# Patient Record
Sex: Female | Born: 1955 | Race: White | Hispanic: No | Marital: Married | Smoking: Former smoker
Health system: Southern US, Community
[De-identification: ages and names within clinical notes are randomized; demographics above are authoritative.]

## PROBLEM LIST (undated history)

## (undated) DIAGNOSIS — C801 Malignant (primary) neoplasm, unspecified: Secondary | ICD-10-CM

## (undated) DIAGNOSIS — M797 Fibromyalgia: Secondary | ICD-10-CM

## (undated) DIAGNOSIS — C439 Malignant melanoma of skin, unspecified: Secondary | ICD-10-CM

## (undated) DIAGNOSIS — C50919 Malignant neoplasm of unspecified site of unspecified female breast: Secondary | ICD-10-CM

## (undated) HISTORY — DX: Malignant melanoma of skin, unspecified: C43.9

## (undated) HISTORY — DX: Fibromyalgia: M79.7

## (undated) HISTORY — DX: Malignant neoplasm of unspecified site of unspecified female breast: C50.919

## (undated) HISTORY — PX: TONSILLECTOMY: SUR1361

## (undated) HISTORY — PX: ABDOMINAL HYSTERECTOMY: SHX81

## (undated) HISTORY — PX: OTHER SURGICAL HISTORY: SHX169

---

## 2003-05-02 HISTORY — PX: MASTECTOMY: SHX3

## 2005-07-04 ENCOUNTER — Ambulatory Visit: Admission: RE | Admit: 2005-07-04 | Discharge: 2005-09-12 | Payer: Self-pay | Admitting: *Deleted

## 2005-07-14 ENCOUNTER — Ambulatory Visit (HOSPITAL_COMMUNITY): Admission: RE | Admit: 2005-07-14 | Discharge: 2005-07-14 | Payer: Self-pay | Admitting: Hematology and Oncology

## 2010-10-27 ENCOUNTER — Other Ambulatory Visit (HOSPITAL_COMMUNITY): Payer: Self-pay | Admitting: Internal Medicine

## 2010-10-27 DIAGNOSIS — C50919 Malignant neoplasm of unspecified site of unspecified female breast: Secondary | ICD-10-CM

## 2010-11-21 ENCOUNTER — Encounter (HOSPITAL_COMMUNITY)
Admission: RE | Admit: 2010-11-21 | Discharge: 2010-11-21 | Disposition: A | Discharge status: Home / Self Care | Payer: PRIVATE HEALTH INSURANCE | Source: Ambulatory Visit | Attending: Internal Medicine | Admitting: Internal Medicine

## 2010-11-21 ENCOUNTER — Other Ambulatory Visit (HOSPITAL_COMMUNITY): Payer: Self-pay | Admitting: Hematology and Oncology

## 2010-11-21 ENCOUNTER — Encounter (HOSPITAL_COMMUNITY): Payer: Self-pay

## 2010-11-21 DIAGNOSIS — C50919 Malignant neoplasm of unspecified site of unspecified female breast: Secondary | ICD-10-CM | POA: Insufficient documentation

## 2010-11-21 HISTORY — DX: Malignant (primary) neoplasm, unspecified: C80.1

## 2010-11-21 LAB — GLUCOSE, CAPILLARY: Glucose-Capillary: 98 mg/dL (ref 70–99)

## 2010-11-21 MED ORDER — FLUDEOXYGLUCOSE F - 18 (FDG) INJECTION
16.7000 | Freq: Once | INTRAVENOUS | Status: AC | PRN
  Administered 2010-11-21: 16.7 via INTRAVENOUS

## 2011-10-30 ENCOUNTER — Encounter: Payer: PRIVATE HEALTH INSURANCE | Admitting: Hematology and Oncology

## 2011-10-30 DIAGNOSIS — C50919 Malignant neoplasm of unspecified site of unspecified female breast: Secondary | ICD-10-CM

## 2019-03-26 HISTORY — PX: COLONOSCOPY: SHX174

## 2019-06-30 NOTE — Progress Notes (Signed)
NEUROLOGY CONSULTATION NOTE  ELIDIA ROSEN MRN: [id number] DOB: 03/28/1956  Referring provider: Jerene Bears, MD Primary care provider: Jerene Bears, MD  Reason for consult:  Bell's palsy  HISTORY OF PRESENT ILLNESS: Kristen Solis is a 64 year old right-handed female who presents for Bell's palsy.  History supplemented by referring provider note.  She started having dizziness around Thanksgiving.  She stood up and started experiencing spinning.  It would be triggered by movement lasting a couple of minutes but would still feel lightheaded in between attacks.  This went on for about an hour .  She had a CT head.  Results not available but she was told that she had an old stroke but likely an incidental finding.  Around the same time, she started drooling from both sides of her mouth.  She was told by her rheumatologist that she may have had Bell's palsy.  She denies noticing unilateral or bilateral facial weakness, facial numbness, hyperacusis or difficulty closing an eye.  She reports blurred vision for which she wears glasses but believes that the vision is still poor with glasses if she is reading for prolonged periods.  At the time, she also noted dark discoloration right sided of neck with associated pain It was painful to turn her neck.  She had pain across upper back and back of head and soreness head. No radicular pain down the arm.  She has fibromyalgia. Symptoms overall improved but now thinks that the drooling has more recently increased, worse on left.  Gradual.   PAST MEDICAL HISTORY: Past Medical History:  Diagnosis Date  . Cancer [facility])     PAST SURGICAL HISTORY: Past Surgical History:  Procedure Laterality Date  . ABDOMINAL HYSTERECTOMY    . ingrown toenails    . masectomy    . right thumb surgery    . TONSILLECTOMY      MEDICATIONS: Outpatient Encounter Medications as of 07/01/2019  Medication Sig  . acetaminophen (TYLENOL) 500 MG tablet Take by mouth.  . baclofen  (LIORESAL) 10 MG tablet Take by mouth.  . Cetirizine HCl 10 MG CAPS Take by mouth.  . furosemide (LASIX) 40 MG tablet Take by mouth.  . naproxen (NAPROSYN) 375 MG tablet Take by mouth.   No facility-administered encounter medications on file as of 07/01/2019.    ALLERGIES: Allergies  Allergen Reactions  . Codeine   . Sulfa Drugs Cross Reactors     FAMILY HISTORY: History reviewed. No pertinent family history.  SOCIAL HISTORY: Social History   Socioeconomic History  . Marital status: Married    Spouse name: Not on file  . Number of children: Not on file  . Years of education: Not on file  . Highest education level: Not on file  Occupational History  . Not on file  Tobacco Use  . Smoking status: Not on file  Substance and Sexual Activity  . Alcohol use: Not on file  . Drug use: Not on file  . Sexual activity: Not on file  Other Topics Concern  . Not on file  Social History Narrative  . Not on file   Social Determinants of Health   Financial Resource Strain:   . Difficulty of Paying Living Expenses: Not on file  Food Insecurity:   . Worried About Charity fundraiser in the Last Year: Not on file  . Ran Out of Food in the Last Year: Not on file  Transportation Needs:   . Lack of Transportation (Medical): Not  on file  . Lack of Transportation (Non-Medical): Not on file  Physical Activity:   . Days of Exercise per Week: Not on file  . Minutes of Exercise per Session: Not on file  Stress:   . Feeling of Stress : Not on file  Social Connections:   . Frequency of Communication with Friends and Family: Not on file  . Frequency of Social Gatherings with Friends and Family: Not on file  . Attends Religious Services: Not on file  . Active Member of Clubs or Organizations: Not on file  . Attends Archivist Meetings: Not on file  . Marital Status: Not on file  Intimate Partner Violence:   . Fear of Current or Ex-Partner: Not on file  . Emotionally Abused: Not  on file  . Physically Abused: Not on file  . Sexually Abused: Not on file    PHYSICAL EXAM: Blood pressure (!) 143/88, pulse 74, resp. rate 18, height 5\' 4"  (1.626 m), weight 153 lb (69.4 kg), SpO2 97 %. General: No acute distress.  Patient appears well-groomed.  Head:  Normocephalic/atraumatic Eyes:  fundi examined but not visualized Neck: supple, no paraspinal tenderness, full range of motion Back: No paraspinal tenderness Heart: regular rate and rhythm Lungs: Clear to auscultation bilaterally. Vascular: No carotid bruits. Neurological Exam: Mental status: alert and oriented to person, place, and time, recent and remote memory intact, fund of knowledge intact, attention and concentration intact, speech fluent and not dysarthric, language intact. Cranial nerves: CN I: not tested CN II: pupils equal, round and reactive to light, visual fields intact CN III, IV, VI:  full range of motion, no nystagmus, no ptosis CN V: facial sensation intact CN VII: upper and lower face symmetric CN VIII: hearing intact CN IX, X: gag intact, uvula midline CN XI: sternocleidomastoid and trapezius muscles intact CN XII: tongue midline Bulk & Tone: normal, no fasciculations. Motor:  5/5 throughout  Sensation:  Pinprick and vibration sensation intact.   Deep Tendon Reflexes:  2+ throughout, toes downgoing.   Finger to nose testing:  Without dysmetria.   Heel to shin:  Without dysmetria.   Gait:  Normal station and stride.  Able to turn and tandem walk. Romberg negative  IMPRESSION: 1.  Episode of vertigo 2.  Drooling.    Unclear etiologies.  The episode of vertigo may have been peripheral vertigo.  I am uncertain about the drooling.  She does not describe symptoms associated Bell's palsy or other convincing lateralizing symptoms.  Neurologic exam does not reveal any obvious lateralizing signs.  PLAN: 1.  I think MRI of brain with and without contrast is reasonable. 2.  Further recommendations  pending results.  Thank you for allowing me to take part in the care of this patient.  Metta Clines, DO  CC:  Jerene Bears, MD

## 2019-07-01 ENCOUNTER — Other Ambulatory Visit: Payer: Self-pay

## 2019-07-01 ENCOUNTER — Encounter: Payer: Self-pay | Admitting: Neurology

## 2019-07-01 ENCOUNTER — Ambulatory Visit (INDEPENDENT_AMBULATORY_CARE_PROVIDER_SITE_OTHER): Payer: Commercial Managed Care - PPO | Admitting: Neurology

## 2019-07-01 VITALS — BP 143/88 | HR 74 | Resp 18 | Ht 64.0 in | Wt 153.0 lb

## 2019-07-01 DIAGNOSIS — Z85820 Personal history of malignant melanoma of skin: Secondary | ICD-10-CM

## 2019-07-01 DIAGNOSIS — H538 Other visual disturbances: Secondary | ICD-10-CM

## 2019-07-01 DIAGNOSIS — R42 Dizziness and giddiness: Secondary | ICD-10-CM | POA: Diagnosis not present

## 2019-07-01 DIAGNOSIS — R29810 Facial weakness: Secondary | ICD-10-CM

## 2019-07-01 NOTE — Patient Instructions (Addendum)
What you are describing doesn't sound like Bell's palsy The dizziness in November may have been inner ear issue However, given your symptoms, we should definitely order MRI of brain with and without contrast Further recommendations pending results.    We have sent a referral to [facility] for your MRI and they will call you directly to schedule your appointment. They are located at [place]. If you need to contact them directly please call [phone].

## 2019-07-07 HISTORY — PX: ESOPHAGOGASTRODUODENOSCOPY: SHX1529

## 2019-08-04 ENCOUNTER — Ambulatory Visit
Admission: RE | Admit: 2019-08-04 | Discharge: 2019-08-04 | Disposition: A | Discharge status: Home / Self Care | Payer: Commercial Managed Care - PPO | Source: Ambulatory Visit | Attending: Neurology | Admitting: Neurology

## 2019-08-04 ENCOUNTER — Other Ambulatory Visit: Payer: Self-pay

## 2019-08-04 DIAGNOSIS — R42 Dizziness and giddiness: Secondary | ICD-10-CM

## 2019-08-04 DIAGNOSIS — Z85820 Personal history of malignant melanoma of skin: Secondary | ICD-10-CM

## 2019-08-04 DIAGNOSIS — R29810 Facial weakness: Secondary | ICD-10-CM

## 2019-08-04 DIAGNOSIS — H538 Other visual disturbances: Secondary | ICD-10-CM

## 2019-08-04 MED ORDER — GADOBENATE DIMEGLUMINE 529 MG/ML IV SOLN
13.0000 mL | Freq: Once | INTRAVENOUS | Status: AC | PRN
  Administered 2019-08-04: 10:00:00 13 mL via INTRAVENOUS

## 2020-07-01 ENCOUNTER — Encounter (INDEPENDENT_AMBULATORY_CARE_PROVIDER_SITE_OTHER): Payer: Self-pay | Admitting: *Deleted

## 2020-10-04 ENCOUNTER — Other Ambulatory Visit: Payer: Self-pay

## 2020-10-04 ENCOUNTER — Ambulatory Visit (INDEPENDENT_AMBULATORY_CARE_PROVIDER_SITE_OTHER): Payer: Commercial Managed Care - PPO | Admitting: Gastroenterology

## 2020-10-04 ENCOUNTER — Encounter (INDEPENDENT_AMBULATORY_CARE_PROVIDER_SITE_OTHER): Payer: Self-pay | Admitting: Gastroenterology

## 2020-10-04 DIAGNOSIS — R1110 Vomiting, unspecified: Secondary | ICD-10-CM

## 2020-10-04 MED ORDER — ESOMEPRAZOLE MAGNESIUM 40 MG PO CPDR: 40.0000 mg | DELAYED_RELEASE_CAPSULE | Freq: Every day | ORAL | 3 refills | Status: AC

## 2020-10-04 NOTE — Patient Instructions (Signed)
Will start Nexium 40 mg qday  Instruction provided in the use of antireflux medication - patient should take medication in the morning 30-45 minutes before eating breakfast. Discussed avoidance of eating within 2 hours of lying down to sleep and benefit of blocks to elevate head of bed. If symptoms recur, will plan for pH impedance testing OFF PPI

## 2020-10-04 NOTE — Progress Notes (Signed)
Kristen Solis, M.D. Gastroenterology & Hepatology [facility] [address] [place], [place] [address] Primary Care Physician: Glenda Chroman, MD [address] [place] [address]  Referring MD: PCP  Chief Complaint: Regurgitation  History of Present Illness: Kristen Solis is a 65 y.o. female with past medical history of melanoma s/p resection of thumb, skin cancer, breast cancer s/p R mastectomy with recurrence but now on remission, fibromyalgia, who presents for evaluation of regurgitation.  Patient reports that for the last year she has presented recurrent episodes of regurgitation of acid contents in her mouth. States the episodes are worse depending on the type of food that she eats, but it is happening almost on a daily basis even if she tries to avoid.  Reports. Denies any heartburn sensation.  Has noticed daily nausea for the last month but she has not presented any vomiting. Due to these symptoms she was given Prilosec 20 mg every day for a year (was taking it in the AM daily and usually did not have breakfast) but denied any improvement with the medication, so stopped taking it 2 months ago. The patient denies having any dysphagia, odynophagia, heartburn, fever, chills, hematochezia, melena, hematemesis, abdominal distention, abdominal pain,  jaundice, pruritus or weight loss.  She is concerned as she has felt more short of breath when she is sitting up. She has presented shortness of breath when laying flat so she has had to sleep with her head elevated. States she has been seen a pulmonologist but she has not been given a reason of why she is SOB. She states she feels SOB quite frequently.  States that she has also seen a cardiologist.  So far she does not know why she has these symptoms.  Last year she was having multiple episodes of vomiting and nausea but patient reports that her symptoms improved after she tried to eat more  vegetables and less greasy food. Now she is having a BM every day.  Last EGD:07/07/2019 -hiatal hernia, normal stomach and duodenum. Last Colonoscopy: 2021 - normal per patient, no report is available  FHx: neg for any gastrointestinal/liver disease, no malignancies Social: quit smoking 3 years ago, neg alcohol or illicit drug use Surgical: hysterectomy and tubal ligation  Past Medical History: Past Medical History:  Diagnosis Date  . Cancer [facility])     Past Surgical History: Past Surgical History:  Procedure Laterality Date  . ABDOMINAL HYSTERECTOMY    . ingrown toenails    . masectomy    . right thumb surgery    . TONSILLECTOMY      Family History:History reviewed. No pertinent family history.  Social History: Social History   Tobacco Use  Smoking Status Former Smoker  Smokeless Tobacco Never Used   Social History   Substance and Sexual Activity  Alcohol Use Never   Social History   Substance and Sexual Activity  Drug Use Never    Allergies: Allergies  Allergen Reactions  . Codeine   . Sulfa Drugs Cross Reactors     Medications: Current Outpatient Medications  Medication Sig Dispense Refill  . acetaminophen (TYLENOL) 500 MG tablet Take by mouth as needed.    . Cetirizine HCl 10 MG CAPS Take by mouth daily.    Marland Kitchen LYSINE PO Take by mouth in the morning and at bedtime.    Marland Kitchen spironolactone (ALDACTONE) 25 MG tablet Take 25 mg by mouth daily.     No current facility-administered medications for this  visit.    Review of Systems: GENERAL: negative for malaise, night sweats HEENT: No changes in hearing or vision, no nose bleeds or other nasal problems. NECK: Negative for lumps, goiter, pain and significant neck swelling RESPIRATORY: Negative for cough, wheezing CARDIOVASCULAR: Negative for chest pain, leg swelling, palpitations, orthopnea GI: SEE HPI MUSCULOSKELETAL: Negative for joint pain or swelling, back pain, and muscle pain. SKIN: Negative for lesions,  rash PSYCH: Negative for sleep disturbance, mood disorder and recent psychosocial stressors. HEMATOLOGY Negative for prolonged bleeding, bruising easily, and swollen nodes. ENDOCRINE: Negative for cold or heat intolerance, polyuria, polydipsia and goiter. NEURO: negative for tremor, gait imbalance, syncope and seizures. The remainder of the review of systems is noncontributory.   Physical Exam: BP (!) 143/80 (BP Location: Left Arm, Patient Position: Sitting, Cuff Size: Large)   Pulse 81   Temp 98.6 F (37 C) (Oral)   Ht 5\' 4"  (1.626 m)   Wt 159 lb (72.1 kg)   BMI 27.29 kg/m  GENERAL: The patient is AO x3, in no acute distress. HEENT: Head is normocephalic and atraumatic. EOMI are intact. Mouth is well hydrated and without lesions. NECK: Supple. No masses LUNGS: Clear to auscultation. No presence of rhonchi/wheezing/rales. Adequate chest expansion HEART: RRR, normal s1 and s2. ABDOMEN: Soft, nontender, no guarding, no peritoneal signs, and nondistended. BS +. No masses. EXTREMITIES: Without any cyanosis, clubbing, rash, lesions or edema. NEUROLOGIC: AOx3, no focal motor deficit. SKIN: no jaundice, no rashes   Imaging/Labs: as above  I personally reviewed and interpreted the available labs, imaging and endoscopic files.  Impression and Plan: Kristen Solis is a 65 y.o. female with past medical history of melanoma s/p resection of thumb, skin cancer, breast cancer s/p R mastectomy with recurrence but now on remission, fibromyalgia, who presents for evaluation of regurgitation.  The patient has presented recurrent symptoms of regurgitation without presence of red flag signs.  She did not improve with intake of PPI for a year, even though she took it compliantly.  I had a thorough discussion with the patient regarding the fact that it may be important to evaluate her symptoms further with a pH impedance testing for 24 hours as it is unusual for regurgitation due to GERD to worsen while  she was on a PPI.  This could be related to nonerosive reflux versus functional etiology.  However, the patient reported that she had "multiple issues ongoing at the moment" I would like to try other medication before proceeding with this test.  I will start her on Nexium 40 mg every day.  If her symptoms recur, we will need to proceed with further testing with of PPI.  Regarding her shortness of breath, her symptoms do not seem to be associated to her regurgitation episodes.  I advised the patient to follow-up again with her pulmonologist and cardiologist to evaluate this further.  She understood and agreed.  - Will start Nexium 40 mg qday - Instruction provided in the use of antireflux medication - patient should take medication in the morning 30-45 minutes before eating breakfast. Discussed avoidance of eating within 2 hours of lying down to sleep and benefit of blocks to elevate head of bed. - If symptoms recur, will plan for pH impedance testing OFF PPI - Follow up with pulmonologist and cardiologist  All questions were answered.      Kristen Peppers, MD Gastroenterology and Hepatology [facility]

## 2020-10-05 ENCOUNTER — Encounter (INDEPENDENT_AMBULATORY_CARE_PROVIDER_SITE_OTHER): Payer: Self-pay

## 2021-01-18 IMAGING — MR MR HEAD WO/W CM
11 series · 48 of 48 positions shown · IV contrast (13 ml Multihance)
Comparison: None.

CLINICAL DATA: Headaches. Vertigo. Facial weakness. Blurred vision.
Personal history of melanoma and breast cancer.

EXAM:
MRI HEAD WITHOUT AND WITH CONTRAST
TECHNIQUE: Multiplanar, multiecho pulse sequences of the brain and surrounding
structures were obtained without and with intravenous contrast.
CONTRAST:  13mL MULTIHANCE GADOBENATE DIMEGLUMINE 529 MG/ML IV SOLN

[Series 3: T1 · sagittal · 5.0mm · 0.45mm/px · 1 of 21 slices shown]
[im 1/21]
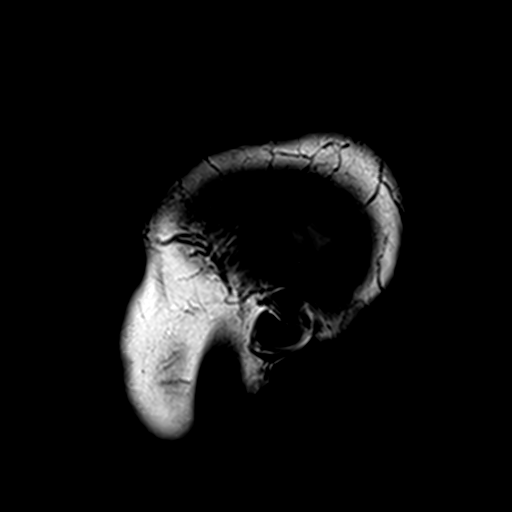

[Series 4: DWI · axial · 3.0mm · 1.80mm/px · z∈[-71,+76]mm · 8 of 100 slices shown (1 of 2)]
[im 1/100]
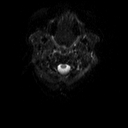
[im 15/100]
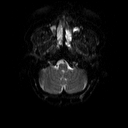
[im 29/100]
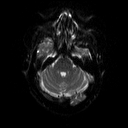
[im 43/100]
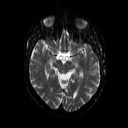
[im 57/100]
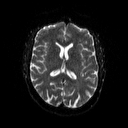
[im 71/100]
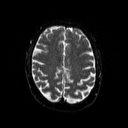
[im 85/100]
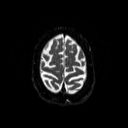
[im 100/100]
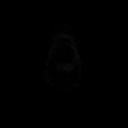

[Series 5: DWI · axial · 3.0mm · 1.80mm/px · z∈[-71,+76]mm · 4 of 50 slices shown (2 of 2)]
[im 1/50]
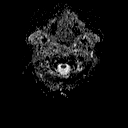
[im 17/50]
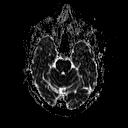
[im 33/50]
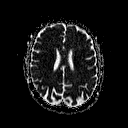
[im 50/50]
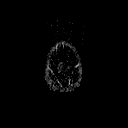

[Series 6: T2 · axial · 5.0mm · 0.51mm/px · z∈[-76,+79]mm · 2 of 24 slices shown (1 of 2)]
[im 1/24]
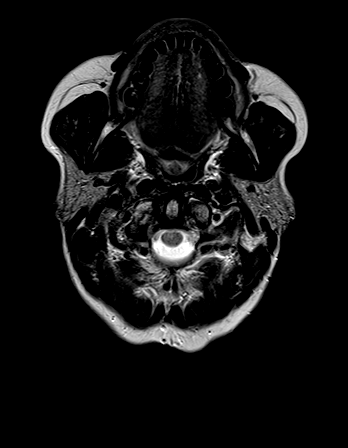
[im 24/24]
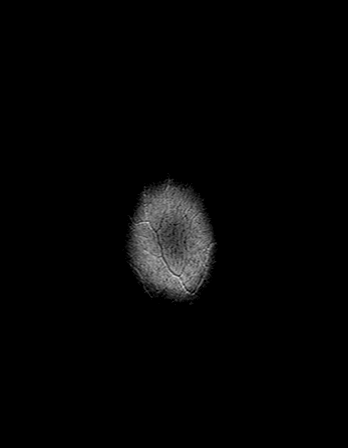

[Series 7: FLAIR · axial · 3.0mm · 0.45mm/px · z∈[-74,+79]mm · 2 of 30 slices shown]
[im 1/30]
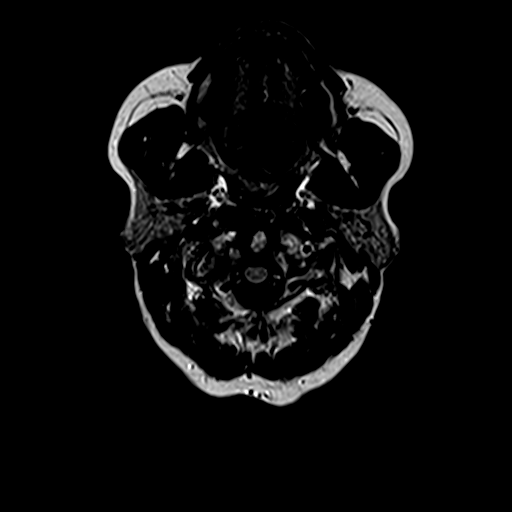
[im 30/30]
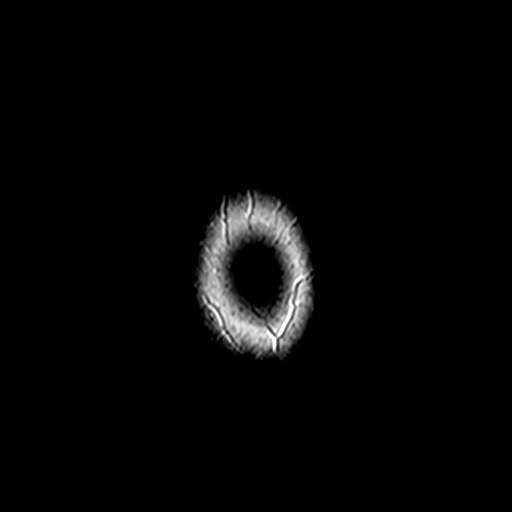

[Series 9: swi_images · axial · 4.0mm · 0.90mm/px · z∈[-68,+72]mm · 3 of 36 slices shown]
[im 1/36]
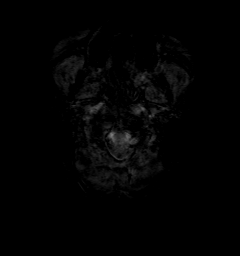
[im 18/36]
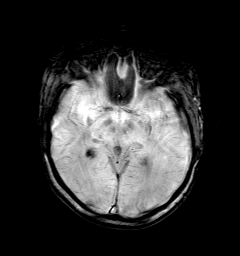
[im 36/36]
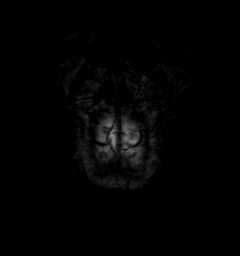

[Series 10: t1_mpr_tra · axial · 1.0mm · 0.75mm/px · z∈[-70,+73]mm · 11 of 144 slices shown (1 of 2)]
[im 1/144]
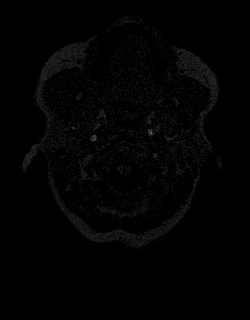
[im 15/144]
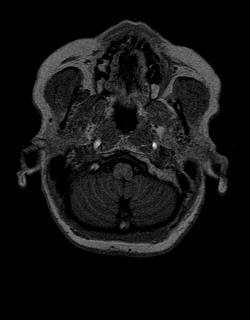
[im 29/144]
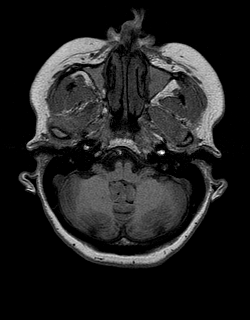
[im 43/144]
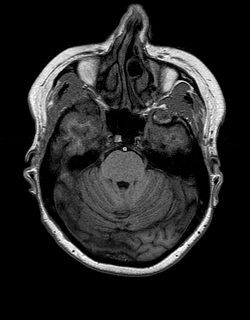
[im 58/144]
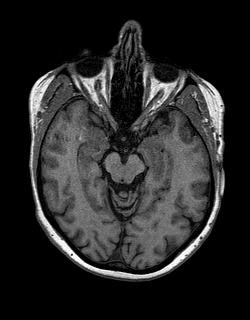
[im 72/144]
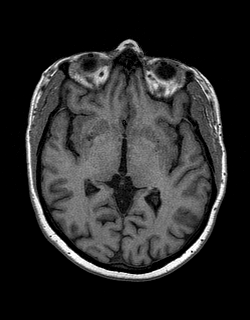
[im 86/144]
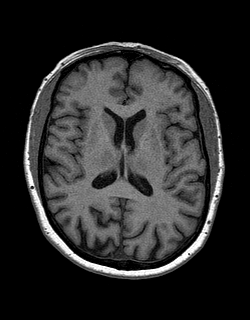
[im 101/144]
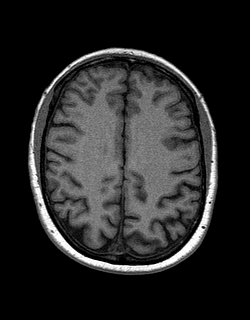
[im 115/144]
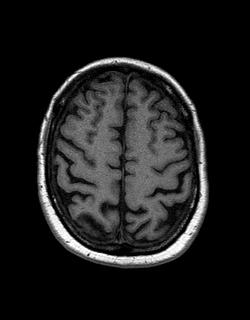
[im 129/144]
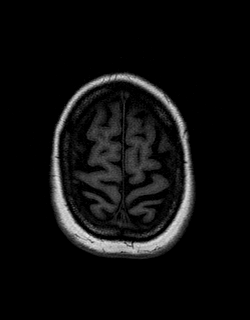
[im 144/144]
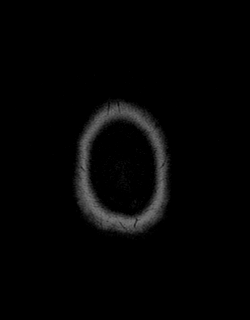

[Series 11: T2 · coronal · 5.0mm · 0.45mm/px · 2 of 25 slices shown (2 of 2)]
[im 1/25]
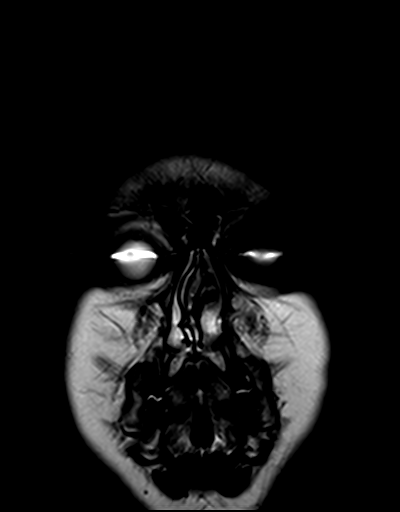
[im 25/25]
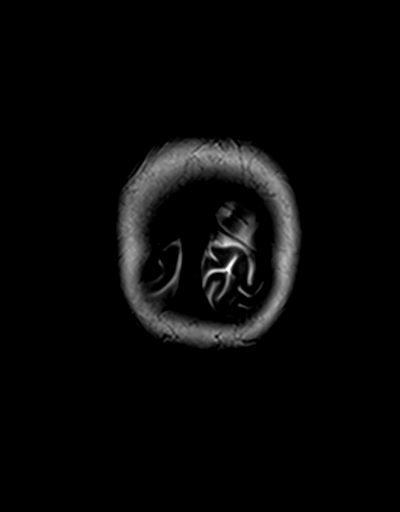

[Series 12: t1_mpr_tra · axial · 1.0mm · 0.75mm/px · z∈[-70,+73]mm · 11 of 144 slices shown (2 of 2)]
[im 1/144]
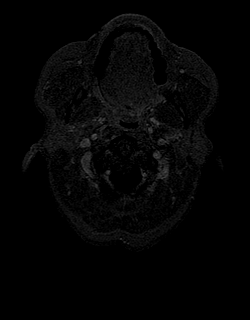
[im 15/144]
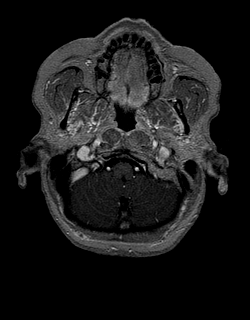
[im 29/144]
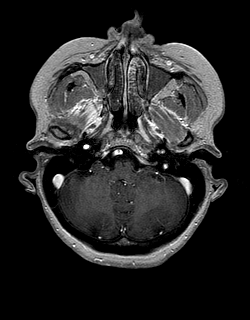
[im 43/144]
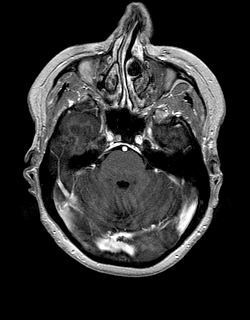
[im 58/144]
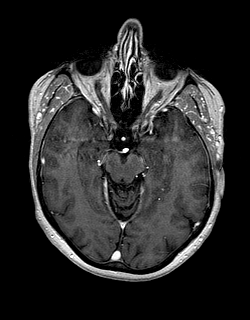
[im 72/144]
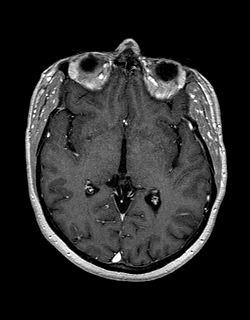
[im 86/144]
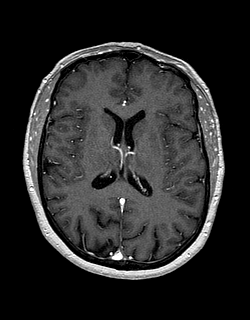
[im 101/144]
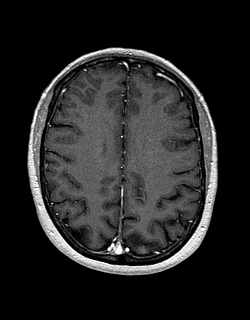
[im 115/144]
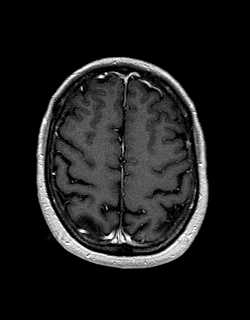
[im 129/144]
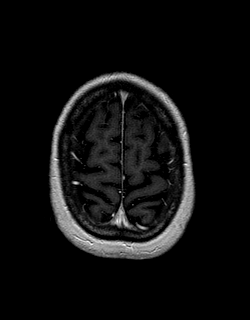
[im 144/144]
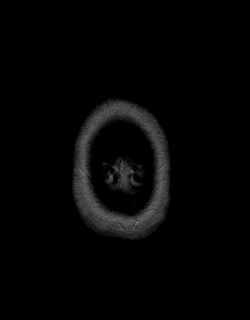

[Series 13: post cor · coronal · 5.0mm · 0.45mm/px · 2 of 25 slices shown]
[im 1/25]
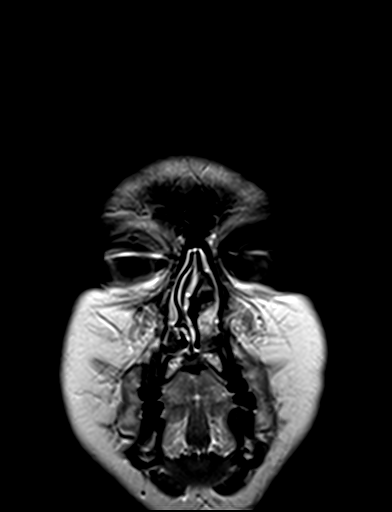
[im 25/25]
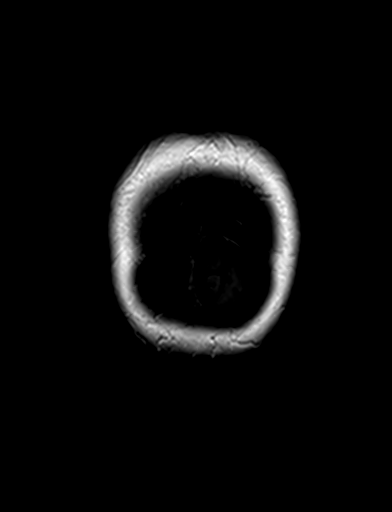

[Series 14: post sag · sagittal · 5.0mm · 0.45mm/px · 2 of 21 slices shown]
[im 1/21]
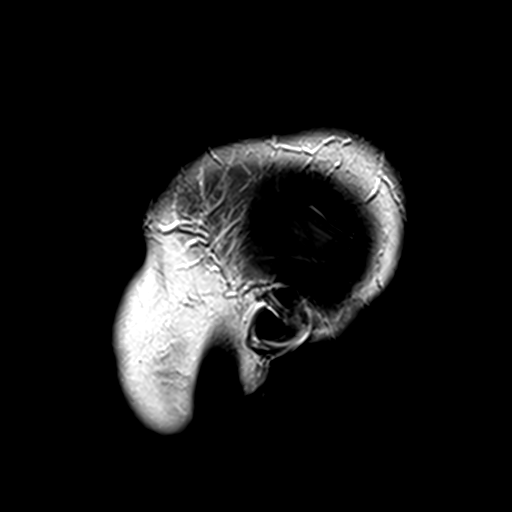
[im 21/21]
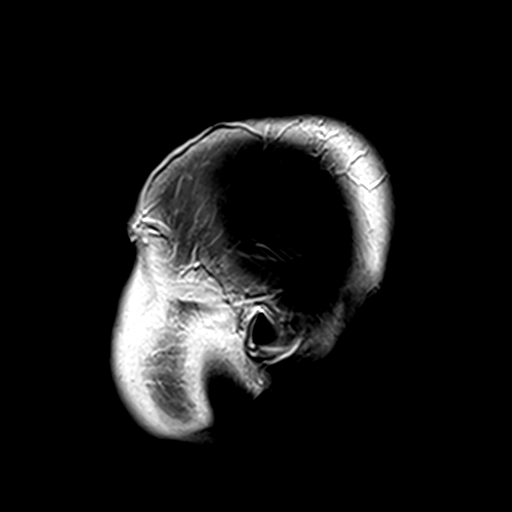

[48 of 48 positions shown; findings below may reference images not displayed]

FINDINGS: Brain: No acute infarct, hemorrhage, or mass lesion is present. The
ventricles are of normal size. No significant extraaxial fluid
collection is present. No significant white matter lesions are
present. The internal auditory canals are within normal limits. The
brainstem and cerebellum are within normal limits.

Vascular: Flow is present in the major intracranial arteries.

Skull and upper cervical spine: The craniocervical junction is
normal. Upper cervical spine is within normal limits. Marrow signal
is unremarkable.

Sinuses/Orbits: Chronic maxillary opacification is present
bilaterally. The paranasal sinuses and mastoid air cells are
otherwise clear. The globes and orbits are within normal limits.
IMPRESSION: 1. Normal MRI appearance of the brain. No acute or focal lesion to
explain the patient's symptoms.
2. Chronic maxillary sinus disease.

## 2021-03-01 ENCOUNTER — Other Ambulatory Visit (INDEPENDENT_AMBULATORY_CARE_PROVIDER_SITE_OTHER): Payer: Self-pay

## 2021-03-01 ENCOUNTER — Encounter (INDEPENDENT_AMBULATORY_CARE_PROVIDER_SITE_OTHER): Payer: Self-pay | Admitting: Gastroenterology

## 2021-03-01 ENCOUNTER — Other Ambulatory Visit: Payer: Self-pay

## 2021-03-01 ENCOUNTER — Ambulatory Visit (INDEPENDENT_AMBULATORY_CARE_PROVIDER_SITE_OTHER): Payer: Commercial Managed Care - PPO | Admitting: Gastroenterology

## 2021-03-01 ENCOUNTER — Encounter (INDEPENDENT_AMBULATORY_CARE_PROVIDER_SITE_OTHER): Payer: Self-pay

## 2021-03-01 VITALS — BP 144/82 | HR 66 | Temp 97.6°F | Ht 64.0 in | Wt 154.4 lb

## 2021-03-01 DIAGNOSIS — R634 Abnormal weight loss: Secondary | ICD-10-CM

## 2021-03-01 DIAGNOSIS — R140 Abdominal distension (gaseous): Secondary | ICD-10-CM

## 2021-03-01 DIAGNOSIS — R630 Anorexia: Secondary | ICD-10-CM | POA: Diagnosis not present

## 2021-03-01 DIAGNOSIS — K219 Gastro-esophageal reflux disease without esophagitis: Secondary | ICD-10-CM | POA: Diagnosis not present

## 2021-03-01 NOTE — Progress Notes (Signed)
Referring Provider: Glenda Chroman, MD Primary Care Physician:  Glenda Chroman, MD Primary GI Physician: Jenetta Downer  Chief Complaint  Patient presents with   Abdominal Pain    Patient here today with complaints of nausea at times. She states she has multiple trips to the restroom for bm. She reports the stools were of normal consistency.She has bloating also.     HPI:   Kristen Solis is a 65 y.o. female with past medical history of melanoma s/p resection of thumb, skin cancer, breast cancer s/p R mastectomy w recurrence but now in remission, fibromyalgia.   Patient presenting today bloating, decreased appetite and weight loss. Last seen in clinic in June 2022 for c/o regurgitation.  Patient states that she is having a lot of bloating and gas. She has taken gas x without much relief. She reports that she has had bloating for the past year. She states previously she would have episodes of vomiting and diarrhea at the same time. She changed her diet at the beginning of the year and is eating more vegetables, avoiding breads, potatoes, processed foods and pork. She will have occasional nausea and occasional diarrhea, but usually related to what she eats, this is very infrequent now. . She reports that she does not eat much throughout the day as Her appetite is decreased for the past month, she will eat maybe one meal per day. She reports that she just feels very full all the time. She has lost 5 pounds since June.   Notably, she states that when she had EGD done in 2021, the doctor who did it mentioned that he saw a "shadow" during the procedure but was unable to determine what it was from.  She has 1 BM daily. She denies any blood in stools or black stools. Very occasional abdominal pain in lower abdomen, though she had recent bladder procedure and thinks it is likely related to this.  She reports that she is currently on Nexium 40mg , she takes this at noon each day. She reports that she is  having reflux maybe twice during the week. She sleeps elevated to help prevent acid regurgitation. She does not take anything to help when she has breakthrough reflux. She denies dysphagia or odynophagia. No cough, sore throat or hoarse voice.  She has some ongoing issues with her breathing, sometimes feels as though she cannot take a deep breath, this has been going on for some time, she has not seen a pulmonologist for this yet.   Social YT:KZSWFUXN smoker, no etoh or illicit drugs Fam hx: neg CRC, liver disease  Last Colonoscopy: 03/26/19 mild diverticulosis of sigmoid, otherwise normal Last Endoscopy:07/07/19 hh, normal stomach and duodenum  Past Medical History:  Diagnosis Date   Cancer [facility])     Past Surgical History:  Procedure Laterality Date   ABDOMINAL HYSTERECTOMY     ingrown toenails     masectomy     MASTECTOMY Right 2005   right thumb surgery     TONSILLECTOMY      Current Outpatient Medications  Medication Sig Dispense Refill   acetaminophen (TYLENOL) 500 MG tablet Take by mouth as needed.     Cetirizine HCl 10 MG CAPS Take by mouth daily.     ergocalciferol (VITAMIN D2) 1.25 MG (50000 UT) capsule Take 50,000 Units by mouth once a week.     esomeprazole (NEXIUM) 40 MG capsule Take 1 capsule (40 mg total) by mouth daily at 12 noon. 90 capsule 3  LYSINE PO Take by mouth daily.     spironolactone (ALDACTONE) 25 MG tablet Take 25 mg by mouth daily.     tiZANidine (ZANAFLEX) 4 MG capsule Take 4 mg by mouth 3 (three) times daily.     No current facility-administered medications for this visit.    Allergies as of 03/01/2021 - Review Complete 03/01/2021  Allergen Reaction Noted   Codeine  11/21/2010   Estrogens Other (See Comments) 03/01/2021   Sulfa drugs cross reactors  11/21/2010    History reviewed. No pertinent family history.  Social History   Socioeconomic History   Marital status: Married    Spouse name: Not on file   Number of children: 1   Years of  education: 12   Highest education level: Not on file  Occupational History   Occupation: not employed  Tobacco Use   Smoking status: Former   Smokeless tobacco: Never  Scientific laboratory technician Use: Never used  Substance and Sexual Activity   Alcohol use: Never   Drug use: Never   Sexual activity: Not on file  Other Topics Concern   Not on file  Social History Narrative   Right handed   One story home   Drinks caffeine coffee   Social Determinants of Health   Financial Resource Strain: Not on file  Food Insecurity: Not on file  Transportation Needs: Not on file  Physical Activity: Not on file  Stress: Not on file  Social Connections: Not on file   Review of systems General: negative for malaise, night sweats, fever, chills +weight loss Neck: Negative for lumps, goiter, pain and significant neck swelling Resp: Negative for cough, wheezing, dyspnea at rest CV: Negative for chest pain, leg swelling, palpitations, orthopnea GI: denies melena, hematochezia, vomiting, constipation, dysphagia, odyonophagia. +decreased appetite+ weight loss +occasional nausea +occasional diarrhea MSK: Negative for joint pain or swelling, back pain, and muscle pain. Derm: Negative for itching or rash Psych: Denies depression, anxiety, memory loss, confusion. No homicidal or suicidal ideation.  Heme: Negative for prolonged bleeding, bruising easily, and swollen nodes. Endocrine: Negative for cold or heat intolerance, polyuria, polydipsia and goiter. Neuro: negative for tremor, gait imbalance, syncope and seizures. The remainder of the review of systems is noncontributory.  Physical Exam: BP (!) 144/82 (BP Location: Left Arm, Patient Position: Sitting, Cuff Size: Small)   Pulse 66   Temp 97.6 F (36.4 C) (Oral)   Ht 5\' 4"  (1.626 m)   Wt 154 lb 6.4 oz (70 kg)   BMI 26.50 kg/m  General:   Alert and oriented. No distress noted. Pleasant and cooperative.  Head:  Normocephalic and atraumatic. Eyes:   Conjuctiva clear without scleral icterus. Mouth:  Oral mucosa pink and moist. Good dentition. No lesions. Heart: Normal rate and rhythm, s1 and s2 heart sounds present.  Lungs: Clear lung sounds in all lobes. Respirations equal and unlabored. Abdomen:  +BS, soft, non-tender and non-distended. No rebound or guarding. No HSM or masses noted. Derm: No palmar erythema or jaundice Msk:  Symmetrical without gross deformities. Normal posture. Extremities:  Without edema. Neurologic:  Alert and  oriented x4 Psych:  Alert and cooperative. Normal mood and affect.  Invalid input(s): 6 MONTHS   ASSESSMENT: Kristen Solis is a 65 y.o. female presenting today for bloating, decreased appetite and weight loss.  Abdominal bloating has been present for about 1 year, decreased appetite x1 month. 5 pound weight loss since June. She is avoiding certain foods which helped her previous symptoms  of n/v/d, however, she reports she just does not feel hungry due to bloating, has taken gas x without relief. Eats 1 good meal a day. I recommended she try IBgard for bloating symptoms, we will proceed with EGD for further evaluation as we cannot rule out PUD, gastritis, H pylori, or malignancy. I will also order celiac testing, though I have a low suspicion for this, we will rule it out as a cause for her symptoms.   She is taking nexium 40mg  once daily, prescription was written for her to take it at noon each day which is what she has been doing. She is having maybe 2 episodes of breakthrough reflux per week. I advised her to take nexium 30-45 minutes prior to breakfast. We will add pepcid 20mg  in the evening to her regimen. She should continue to keep head of bed elevated, stay upright 2-3 hours after eating and avoid, heavy, greasy spicy foods, especially late in the day. She denies dysphagia or odynophagia.    PLAN:  Continue nexium 40mg  once daily, taken 30-45 minutes prior to breakfast 2. Pepcid 20mg  in evening 3.  Schedule EGD  4. IBgard for bloating 5. See pulmonologist for ongoing breathing issues  Follow Up: 3 months  Jaeshaun Riva L. Alver Sorrow, MSN, APRN, AGNP-C Adult-Gerontology Nurse Practitioner [facility]

## 2021-03-01 NOTE — Patient Instructions (Addendum)
Try taking your nexium 30-45 minutes before breakfast in the mornings. You can add over the counter famotidine(pepcid) 20mg  in the evening for reflux. This can be taken every evening in addition to your nexium. I would also like you to try over the counter IBgard for your bloating. We will check labs for celiac disease as well.   Continue to avoid foods that seem to worsen your symptoms, be mindful that vegetables are great for you but can sometimes cause worsening bloating. Continue to stay upright 2-3 hours after eating, prior to lying down.  Please consider seeing a pulmonologist for your lung issues.  Follow up 3 months

## 2021-03-01 NOTE — H&P (View-Only) (Signed)
Referring Provider: Glenda Chroman, MD Primary Care Physician:  Glenda Chroman, MD Primary GI Physician: Jenetta Downer  Chief Complaint  Patient presents with   Abdominal Pain    Patient here today with complaints of nausea at times. She states she has multiple trips to the restroom for bm. She reports the stools were of normal consistency.She has bloating also.     HPI:   Kristen Solis is a 65 y.o. female with past medical history of melanoma s/p resection of thumb, skin cancer, breast cancer s/p R mastectomy w recurrence but now in remission, fibromyalgia.   Patient presenting today bloating, decreased appetite and weight loss. Last seen in clinic in June 2022 for c/o regurgitation.  Patient states that she is having a lot of bloating and gas. She has taken gas x without much relief. She reports that she has had bloating for the past year. She states previously she would have episodes of vomiting and diarrhea at the same time. She changed her diet at the beginning of the year and is eating more vegetables, avoiding breads, potatoes, processed foods and pork. She will have occasional nausea and occasional diarrhea, but usually related to what she eats, this is very infrequent now. . She reports that she does not eat much throughout the day as Her appetite is decreased for the past month, she will eat maybe one meal per day. She reports that she just feels very full all the time. She has lost 5 pounds since June.   Notably, she states that when she had EGD done in 2021, the doctor who did it mentioned that he saw a "shadow" during the procedure but was unable to determine what it was from.  She has 1 BM daily. She denies any blood in stools or black stools. Very occasional abdominal pain in lower abdomen, though she had recent bladder procedure and thinks it is likely related to this.  She reports that she is currently on Nexium 40mg , she takes this at noon each day. She reports that she is  having reflux maybe twice during the week. She sleeps elevated to help prevent acid regurgitation. She does not take anything to help when she has breakthrough reflux. She denies dysphagia or odynophagia. No cough, sore throat or hoarse voice.  She has some ongoing issues with her breathing, sometimes feels as though she cannot take a deep breath, this has been going on for some time, she has not seen a pulmonologist for this yet.   Social GY:IRSWNIOE smoker, no etoh or illicit drugs Fam hx: neg CRC, liver disease  Last Colonoscopy: 03/26/19 mild diverticulosis of sigmoid, otherwise normal Last Endoscopy:07/07/19 hh, normal stomach and duodenum  Past Medical History:  Diagnosis Date   Cancer [facility])     Past Surgical History:  Procedure Laterality Date   ABDOMINAL HYSTERECTOMY     ingrown toenails     masectomy     MASTECTOMY Right 2005   right thumb surgery     TONSILLECTOMY      Current Outpatient Medications  Medication Sig Dispense Refill   acetaminophen (TYLENOL) 500 MG tablet Take by mouth as needed.     Cetirizine HCl 10 MG CAPS Take by mouth daily.     ergocalciferol (VITAMIN D2) 1.25 MG (50000 UT) capsule Take 50,000 Units by mouth once a week.     esomeprazole (NEXIUM) 40 MG capsule Take 1 capsule (40 mg total) by mouth daily at 12 noon. 90 capsule 3  LYSINE PO Take by mouth daily.     spironolactone (ALDACTONE) 25 MG tablet Take 25 mg by mouth daily.     tiZANidine (ZANAFLEX) 4 MG capsule Take 4 mg by mouth 3 (three) times daily.     No current facility-administered medications for this visit.    Allergies as of 03/01/2021 - Review Complete 03/01/2021  Allergen Reaction Noted   Codeine  11/21/2010   Estrogens Other (See Comments) 03/01/2021   Sulfa drugs cross reactors  11/21/2010    History reviewed. No pertinent family history.  Social History   Socioeconomic History   Marital status: Married    Spouse name: Not on file   Number of children: 1   Years of  education: 12   Highest education level: Not on file  Occupational History   Occupation: not employed  Tobacco Use   Smoking status: Former   Smokeless tobacco: Never  Scientific laboratory technician Use: Never used  Substance and Sexual Activity   Alcohol use: Never   Drug use: Never   Sexual activity: Not on file  Other Topics Concern   Not on file  Social History Narrative   Right handed   One story home   Drinks caffeine coffee   Social Determinants of Health   Financial Resource Strain: Not on file  Food Insecurity: Not on file  Transportation Needs: Not on file  Physical Activity: Not on file  Stress: Not on file  Social Connections: Not on file   Review of systems General: negative for malaise, night sweats, fever, chills +weight loss Neck: Negative for lumps, goiter, pain and significant neck swelling Resp: Negative for cough, wheezing, dyspnea at rest CV: Negative for chest pain, leg swelling, palpitations, orthopnea GI: denies melena, hematochezia, vomiting, constipation, dysphagia, odyonophagia. +decreased appetite+ weight loss +occasional nausea +occasional diarrhea MSK: Negative for joint pain or swelling, back pain, and muscle pain. Derm: Negative for itching or rash Psych: Denies depression, anxiety, memory loss, confusion. No homicidal or suicidal ideation.  Heme: Negative for prolonged bleeding, bruising easily, and swollen nodes. Endocrine: Negative for cold or heat intolerance, polyuria, polydipsia and goiter. Neuro: negative for tremor, gait imbalance, syncope and seizures. The remainder of the review of systems is noncontributory.  Physical Exam: BP (!) 144/82 (BP Location: Left Arm, Patient Position: Sitting, Cuff Size: Small)   Pulse 66   Temp 97.6 F (36.4 C) (Oral)   Ht 5\' 4"  (1.626 m)   Wt 154 lb 6.4 oz (70 kg)   BMI 26.50 kg/m  General:   Alert and oriented. No distress noted. Pleasant and cooperative.  Head:  Normocephalic and atraumatic. Eyes:   Conjuctiva clear without scleral icterus. Mouth:  Oral mucosa pink and moist. Good dentition. No lesions. Heart: Normal rate and rhythm, s1 and s2 heart sounds present.  Lungs: Clear lung sounds in all lobes. Respirations equal and unlabored. Abdomen:  +BS, soft, non-tender and non-distended. No rebound or guarding. No HSM or masses noted. Derm: No palmar erythema or jaundice Msk:  Symmetrical without gross deformities. Normal posture. Extremities:  Without edema. Neurologic:  Alert and  oriented x4 Psych:  Alert and cooperative. Normal mood and affect.  Invalid input(s): 6 MONTHS   ASSESSMENT: Kristen Solis is a 65 y.o. female presenting today for bloating, decreased appetite and weight loss.  Abdominal bloating has been present for about 1 year, decreased appetite x1 month. 5 pound weight loss since June. She is avoiding certain foods which helped her previous symptoms  of n/v/d, however, she reports she just does not feel hungry due to bloating, has taken gas x without relief. Eats 1 good meal a day. I recommended she try IBgard for bloating symptoms, we will proceed with EGD for further evaluation as we cannot rule out PUD, gastritis, H pylori, or malignancy. I will also order celiac testing, though I have a low suspicion for this, we will rule it out as a cause for her symptoms.   She is taking nexium 40mg  once daily, prescription was written for her to take it at noon each day which is what she has been doing. She is having maybe 2 episodes of breakthrough reflux per week. I advised her to take nexium 30-45 minutes prior to breakfast. We will add pepcid 20mg  in the evening to her regimen. She should continue to keep head of bed elevated, stay upright 2-3 hours after eating and avoid, heavy, greasy spicy foods, especially late in the day. She denies dysphagia or odynophagia.    PLAN:  Continue nexium 40mg  once daily, taken 30-45 minutes prior to breakfast 2. Pepcid 20mg  in evening 3.  Schedule EGD  4. IBgard for bloating 5. See pulmonologist for ongoing breathing issues  Follow Up: 3 months  Gatlin Kittell L. Alver Sorrow, MSN, APRN, AGNP-C Adult-Gerontology Nurse Practitioner [facility]

## 2021-03-15 ENCOUNTER — Other Ambulatory Visit (INDEPENDENT_AMBULATORY_CARE_PROVIDER_SITE_OTHER): Payer: Self-pay

## 2021-03-15 DIAGNOSIS — I10 Essential (primary) hypertension: Secondary | ICD-10-CM

## 2021-03-15 DIAGNOSIS — Z01812 Encounter for preprocedural laboratory examination: Secondary | ICD-10-CM

## 2021-03-21 ENCOUNTER — Other Ambulatory Visit (HOSPITAL_COMMUNITY)
Admission: RE | Admit: 2021-03-21 | Discharge: 2021-03-21 | Disposition: A | Discharge status: Home / Self Care | Payer: Commercial Managed Care - PPO | Source: Ambulatory Visit | Attending: Gastroenterology | Admitting: Gastroenterology

## 2021-03-21 DIAGNOSIS — R140 Abdominal distension (gaseous): Secondary | ICD-10-CM | POA: Diagnosis not present

## 2021-03-22 ENCOUNTER — Telehealth (INDEPENDENT_AMBULATORY_CARE_PROVIDER_SITE_OTHER): Payer: Self-pay

## 2021-03-22 LAB — GLIADIN ANTIBODIES, SERUM
Antigliadin Abs, IgA: 6 units (ref 0–19)
Gliadin IgG: 4 units (ref 0–19)

## 2021-03-22 LAB — TISSUE TRANSGLUTAMINASE, IGA: Tissue Transglutaminase Ab, IgA: <2 U/mL (ref 0–3)

## 2021-03-22 MED ORDER — PEG 3350-KCL-NA BICARB-NACL 420 G PO SOLR: 4000.0000 mL | ORAL | 0 refills | Status: DC

## 2021-03-22 NOTE — Telephone Encounter (Signed)
Kristen Solis, CMA  

## 2021-03-23 ENCOUNTER — Encounter (HOSPITAL_COMMUNITY): Payer: Self-pay | Admitting: Gastroenterology

## 2021-03-23 ENCOUNTER — Ambulatory Visit (HOSPITAL_COMMUNITY): Payer: Commercial Managed Care - PPO | Admitting: Anesthesiology

## 2021-03-23 ENCOUNTER — Encounter (HOSPITAL_COMMUNITY)
Admission: RE | Disposition: A | Discharge status: Home / Self Care | Payer: Self-pay | Source: Home / Self Care | Attending: Gastroenterology

## 2021-03-23 ENCOUNTER — Other Ambulatory Visit: Payer: Self-pay

## 2021-03-23 ENCOUNTER — Ambulatory Visit (HOSPITAL_COMMUNITY)
Admission: RE | Admit: 2021-03-23 | Discharge: 2021-03-23 | Disposition: A | Discharge status: Home / Self Care | Payer: Commercial Managed Care - PPO | Attending: Gastroenterology | Admitting: Gastroenterology

## 2021-03-23 DIAGNOSIS — R634 Abnormal weight loss: Secondary | ICD-10-CM | POA: Insufficient documentation

## 2021-03-23 DIAGNOSIS — Z853 Personal history of malignant neoplasm of breast: Secondary | ICD-10-CM | POA: Diagnosis not present

## 2021-03-23 DIAGNOSIS — Z6825 Body mass index (BMI) 25.0-25.9, adult: Secondary | ICD-10-CM | POA: Diagnosis not present

## 2021-03-23 DIAGNOSIS — M797 Fibromyalgia: Secondary | ICD-10-CM | POA: Insufficient documentation

## 2021-03-23 DIAGNOSIS — R140 Abdominal distension (gaseous): Secondary | ICD-10-CM

## 2021-03-23 DIAGNOSIS — K219 Gastro-esophageal reflux disease without esophagitis: Secondary | ICD-10-CM | POA: Insufficient documentation

## 2021-03-23 DIAGNOSIS — Z79899 Other long term (current) drug therapy: Secondary | ICD-10-CM | POA: Insufficient documentation

## 2021-03-23 DIAGNOSIS — K449 Diaphragmatic hernia without obstruction or gangrene: Secondary | ICD-10-CM | POA: Diagnosis not present

## 2021-03-23 DIAGNOSIS — R109 Unspecified abdominal pain: Secondary | ICD-10-CM

## 2021-03-23 DIAGNOSIS — K317 Polyp of stomach and duodenum: Secondary | ICD-10-CM

## 2021-03-23 DIAGNOSIS — Z87891 Personal history of nicotine dependence: Secondary | ICD-10-CM | POA: Insufficient documentation

## 2021-03-23 DIAGNOSIS — Z9011 Acquired absence of right breast and nipple: Secondary | ICD-10-CM | POA: Insufficient documentation

## 2021-03-23 DIAGNOSIS — Z85820 Personal history of malignant melanoma of skin: Secondary | ICD-10-CM | POA: Diagnosis not present

## 2021-03-23 HISTORY — PX: ESOPHAGOGASTRODUODENOSCOPY (EGD) WITH PROPOFOL: SHX5813

## 2021-03-23 HISTORY — PX: BIOPSY: SHX5522

## 2021-03-23 LAB — RETICULIN ANTIBODIES, IGA W TITER: Reticulin Ab, IgA: NEGATIVE titer (ref <2.5)

## 2021-03-23 SURGERY — ESOPHAGOGASTRODUODENOSCOPY (EGD) WITH PROPOFOL
Anesthesia: General

## 2021-03-23 MED ORDER — PROPOFOL 500 MG/50ML IV EMUL
INTRAVENOUS | Status: DC | PRN
  Administered 2021-03-23: 150 ug/kg/min via INTRAVENOUS

## 2021-03-23 MED ORDER — LIDOCAINE HCL (CARDIAC) PF 100 MG/5ML IV SOSY
PREFILLED_SYRINGE | INTRAVENOUS | Status: DC | PRN
  Administered 2021-03-23: 50 mg via INTRAVENOUS

## 2021-03-23 MED ORDER — PROPOFOL 10 MG/ML IV BOLUS
INTRAVENOUS | Status: DC | PRN
  Administered 2021-03-23: 100 mg via INTRAVENOUS
  Administered 2021-03-23: 50 mg via INTRAVENOUS

## 2021-03-23 MED ORDER — LACTATED RINGERS IV SOLN: INTRAVENOUS | Status: DC

## 2021-03-23 NOTE — Op Note (Signed)
[facility] Patient Name: Kristen Solis Procedure Date: 03/23/2021 10:54 AM MRN: [id number] Date of Birth: 11/21/1955 Attending MD: Maylon Peppers ,  CSN: [id number] Age: 65 Admit Type: Outpatient Procedure:                Upper GI endoscopy Indications:              Abdominal pain, Abdominal bloating Providers:                Maylon Peppers, Lurline Del, RN, Casimer Bilis, Technician Referring MD:              Medicines:                Monitored Anesthesia Care Complications:            No immediate complications. Estimated Blood Loss:     Estimated blood loss: none. Procedure:                Pre-Anesthesia Assessment:                           - Prior to the procedure, a History and Physical                            was performed, and patient medications, allergies                            and sensitivities were reviewed. The patient's                            tolerance of previous anesthesia was reviewed.                           - The risks and benefits of the procedure and the                            sedation options and risks were discussed with the                            patient. All questions were answered and informed                            consent was obtained.                           - ASA Grade Assessment: II - A patient with mild                            systemic disease.                           After obtaining informed consent, the endoscope was                            passed under direct vision. Throughout the  procedure, the patient's blood pressure, pulse, and                            oxygen saturations were monitored continuously. The                            [id number]-[id number] ([id number]) scope was introduced through the                            mouth, and advanced to the second part of duodenum.                            The upper GI endoscopy was accomplished without                             difficulty. The patient tolerated the procedure                            well. Scope In: 11:09:22 AM Scope Out: 11:17:05 AM Total Procedure Duration: 0 hours 7 minutes 43 seconds  Findings:      A 2 cm hiatal hernia was present.      The exam of the esophagus was otherwise normal.      A few 2 to 5 mm semi-sessile polyps were found in the gastric fundus.       They did not have a typical appearance for fundicm gland polyps.       Biopsies were taken with a cold forceps for histology.      The examined duodenum was normal. Biopsies were taken with a cold       forceps for histology. Impression:               - 2 cm hiatal hernia.                           - A few gastric polyps. Biopsied.                           - Normal examined duodenum. Biopsied. Moderate Sedation:      Per Anesthesia Care Recommendation:           - Discharge patient to home (ambulatory).                           - Start a low FODMAP diet                           - Await pathology results. Procedure Code(s):        --- Professional ---                           [id number], Esophagogastroduodenoscopy, flexible,                            transoral; with biopsy, single or multiple Diagnosis Code(s):        --- Professional ---  K44.9, Diaphragmatic hernia without obstruction or                            gangrene                           K31.7, Polyp of stomach and duodenum                           R10.9, Unspecified abdominal pain                           R14.0, Abdominal distension (gaseous) CPT copyright 2019 American Medical Association. All rights reserved. The codes documented in this report are preliminary and upon coder review may  be revised to meet current compliance requirements. Maylon Peppers, MD Maylon Peppers,  03/23/2021 11:24:45 AM This report has been signed electronically. Number of Addenda: 0

## 2021-03-23 NOTE — Anesthesia Procedure Notes (Signed)
Date/Time: 03/23/2021 11:09 AM Performed by: Orlie Dakin, CRNA Pre-anesthesia Checklist: Patient identified, Emergency Drugs available, Suction available and Patient being monitored Patient Re-evaluated:Patient Re-evaluated prior to induction Oxygen Delivery Method: Nasal cannula Induction Type: IV induction Placement Confirmation: positive ETCO2

## 2021-03-23 NOTE — Discharge Instructions (Signed)
You are being discharged to home.  Start a low FODMAP diet  We are waiting for your pathology results.

## 2021-03-23 NOTE — Interval H&P Note (Signed)
History and Physical Interval Note:  03/23/2021 11:02 AM  BP (!) 145/83   Temp 98 F (36.7 C) (Oral)   Resp 18   Ht 5\' 4"  (1.626 m)   Wt 66.7 kg   SpO2 99%   BMI 25.23 kg/m  GENERAL: The patient is AO x3, in no acute distress. HEENT: Head is normocephalic and atraumatic. EOMI are intact. Mouth is well hydrated and without lesions. NECK: Supple. No masses LUNGS: Clear to auscultation. No presence of rhonchi/wheezing/rales. Adequate chest expansion HEART: RRR, normal s1 and s2. ABDOMEN: Soft, nontender, no guarding, no peritoneal signs, and nondistended. BS +. No masses. EXTREMITIES: Without any cyanosis, clubbing, rash, lesions or edema. NEUROLOGIC: AOx3, no focal motor deficit. SKIN: no jaundice, no rashes   [name]  has presented today for surgery, with the diagnosis of Bloating weight loss.  The various methods of treatment have been discussed with the patient and family. After consideration of risks, benefits and other options for treatment, the patient has consented to  Procedure(s) with comments: ESOPHAGOGASTRODUODENOSCOPY (EGD) WITH PROPOFOL (N/A) - 11:05 as a surgical intervention.  The patient's history has been reviewed, patient examined, no change in status, stable for surgery.  I have reviewed the patient's chart and labs.  Questions were answered to the patient's satisfaction.     Kristen Solis

## 2021-03-23 NOTE — Anesthesia Preprocedure Evaluation (Addendum)
Anesthesia Evaluation  Patient identified by MRN, date of birth, ID band Patient awake    Reviewed: Allergy & Precautions, H&P , NPO status , Patient's Chart, lab work & pertinent test results  Airway Mallampati: II  TM Distance: >3 FB Neck ROM: Full    Dental  (+) Dental Advisory Given, Caps Crowns :   Pulmonary neg pulmonary ROS, former smoker,    Pulmonary exam normal breath sounds clear to auscultation       Cardiovascular negative cardio ROS Normal cardiovascular exam Rhythm:Regular Rate:Normal     Neuro/Psych  Neuromuscular disease negative psych ROS   GI/Hepatic Neg liver ROS, GERD  Medicated and Controlled,  Endo/Other  negative endocrine ROS  Renal/GU negative Renal ROS  negative genitourinary   Musculoskeletal  (+) Fibromyalgia -  Abdominal   Peds negative pediatric ROS (+)  Hematology negative hematology ROS (+)   Anesthesia Other Findings Right Breast cancer right thumb Melanoma  Reproductive/Obstetrics negative OB ROS                           Anesthesia Physical Anesthesia Plan  ASA: 2  Anesthesia Plan: General   Post-op Pain Management:    Induction: Intravenous  PONV Risk Score and Plan: TIVA  Airway Management Planned: Nasal Cannula and Natural Airway  Additional Equipment:   Intra-op Plan:   Post-operative Plan:   Informed Consent: I have reviewed the patients History and Physical, chart, labs and discussed the procedure including the risks, benefits and alternatives for the proposed anesthesia with the patient or authorized representative who has indicated his/her understanding and acceptance.     Dental advisory given  Plan Discussed with: CRNA and Surgeon  Anesthesia Plan Comments:         Anesthesia Quick Evaluation

## 2021-03-23 NOTE — Transfer of Care (Signed)
Immediate Anesthesia Transfer of Care Note  Patient: Kristen Solis  Procedure(s) Performed: ESOPHAGOGASTRODUODENOSCOPY (EGD) WITH PROPOFOL BIOPSY  Patient Location: Endoscopy Unit  Anesthesia Type:General  Level of Consciousness: awake  Airway & Oxygen Therapy: Patient Spontanous Breathing  Post-op Assessment: Report given to RN and Post -op Vital signs reviewed and stable  Post vital signs: Reviewed and stable  Last Vitals:  Vitals Value Taken Time  BP    Temp    Pulse    Resp    SpO2      Last Pain:  Vitals:   03/23/21 1049  TempSrc: Oral  PainSc:       Patients Stated Pain Goal: 8 (26/20/35 5974)  Complications: No notable events documented.

## 2021-03-23 NOTE — Anesthesia Postprocedure Evaluation (Signed)
Anesthesia Post Note  Patient: SHIZA THELEN  Procedure(s) Performed: ESOPHAGOGASTRODUODENOSCOPY (EGD) WITH PROPOFOL BIOPSY  Patient location during evaluation: Endoscopy Anesthesia Type: General Level of consciousness: awake and alert and oriented Pain management: pain level controlled Vital Signs Assessment: post-procedure vital signs reviewed and stable Respiratory status: spontaneous breathing, nonlabored ventilation and respiratory function stable Cardiovascular status: blood pressure returned to baseline and stable Postop Assessment: no apparent nausea or vomiting Anesthetic complications: no   No notable events documented.   Last Vitals:  Vitals:   03/23/21 1049 03/23/21 1121  BP: (!) 145/83 124/73  Pulse:  82  Resp: 18 15  Temp: 36.7 C 36.9 C  SpO2: 99% 95%    Last Pain:  Vitals:   03/23/21 1121  TempSrc: Axillary  PainSc: 0-No pain                 Aibhlinn Kalmar C Nickalus Thornsberry

## 2021-03-25 LAB — SURGICAL PATHOLOGY

## 2021-03-29 ENCOUNTER — Encounter (HOSPITAL_COMMUNITY): Payer: Self-pay | Admitting: Gastroenterology

## 2021-06-09 ENCOUNTER — Ambulatory Visit (INDEPENDENT_AMBULATORY_CARE_PROVIDER_SITE_OTHER): Payer: Commercial Managed Care - PPO | Admitting: Gastroenterology

## 2021-07-11 ENCOUNTER — Other Ambulatory Visit: Payer: Self-pay

## 2021-07-11 ENCOUNTER — Encounter (INDEPENDENT_AMBULATORY_CARE_PROVIDER_SITE_OTHER): Payer: Self-pay | Admitting: Gastroenterology

## 2021-07-11 ENCOUNTER — Ambulatory Visit (INDEPENDENT_AMBULATORY_CARE_PROVIDER_SITE_OTHER): Payer: Commercial Managed Care - PPO | Admitting: Gastroenterology

## 2021-07-11 VITALS — BP 148/77 | HR 68 | Temp 99.2°F | Ht 64.0 in | Wt 152.0 lb

## 2021-07-11 DIAGNOSIS — R140 Abdominal distension (gaseous): Secondary | ICD-10-CM | POA: Diagnosis not present

## 2021-07-11 DIAGNOSIS — K219 Gastro-esophageal reflux disease without esophagitis: Secondary | ICD-10-CM

## 2021-07-11 NOTE — Patient Instructions (Signed)
Please continue your nexium '40mg'$  once daily ?Continue reflux precautions, to include avoiding eating late, stay upright 2-3 hours after eating, prior to lying down, avoid tomato based, greasy, spicy foods, caffeine, chocolate and alcohol.  ?Please keep a food journal for the next 2 weeks to keep track of foods that tend to worsen your symptoms. ?You can also start a probiotic, I usually recommend lactobacillus rhamnosus. You can take this daily to see if it provides any relief of your symptoms. ?I am also providing the low FODMAP diet, I encourage you to try this along with the food journal to see if there is an improvement in symptoms. ? ?Follow up 6 months ?

## 2021-07-11 NOTE — Progress Notes (Cosign Needed)
Referring Provider: Moshe Cipro, MD Primary Care Physician:  Moshe Cipro, MD Primary GI Physician: Jenetta Downer  Chief Complaint  Patient presents with   Gastroesophageal Reflux    3 month follow up on GERD. Gets full quick when eating and has soreness top of abdomen. Concerned about having sob. Getting worse. Did see cardiology and discussed this with him. Scheduled for stress test and ECHO next month.    HPI:   Kristen Solis is a 66 y.o. female with past medical history of melanoma s/p resection of thumb, skin cancer, breast cancer s/p R mastectomy w recurrence but now in remission, fibromyalgia.   Patient presenting today for follow up.  Patient last seen 03/01/22 for bloating, decreased appetite, weight loss. Maintained at that time on nexium '40mg'$  with reflux maybe twice per week. She also endorsed SOB at that time, sometimes associated with eating. Pepcid '20mg'$  QHS added and patient scheduled for EGD for further evaluation.   EGD 03/23/21 with only findings as below, patient recommended to do low FODMAP diet.   Today, patient states that she continues to have fullness with eating and "soreness" in her epigastric to LUQ area, sometimes will last for at least a day. She has some nausea but no vomiting. She states that she continues to feel fully early when eating at times. She states that reflux is fairly well controlled, continues to have acid regurgitation maybe twice per week.  Occasionally has some heartburn. She continues to have SOB, this can occur at any time, sometimes during activity, eating and sometimes even at rest. She feels that she has a lot of pressure around her lower rib area, sometimes she can put her fingers under the edge of her sternum to help relieve the pressure. She is not taking pepcid as she tried it previously without any results. She denies any dysphagia, has some dry coughing and hoarseness at times. She denies sore throat. No nausea or vomiting. She  continues to sleep with her head elevated at night. She is trying to cut out certain foods that seem to trigger her symptoms. She is doing a lot of veggies but is trying to avoid ones that tend to cause her more bloating. She is taking gas-x now but does not feel that this is helping any. Denies rectal bleeding or melena. Weight is stable.   She states that she had been having some swelling to lower R leg, she had some testing done for DVTs and they told her she has some venous reflux. Is supposed to be seeing vascular in regards to this. She continues to have issues with her breathing with SOB occurring at any given time, sometimes even at rest. She is supposed to be having ECHO and stress test next month in regards to this.   Last Colonoscopy:03/26/19 mild diverticulosis of sigmoid colon, otherwise normal Last Endoscopy:11/23/222 cm hiatal hernia. - A few gastric polyps. -fundic gland polyp - Normal examined duodenum. Biopsies normal  Past Medical History:  Diagnosis Date   Breast cancer [facility])    s/p R mastectomy   Cancer ([place])    Fibromyalgia    Melanoma ([place])    thumb, s/p resection   Past Surgical History:  Procedure Laterality Date   ABDOMINAL HYSTERECTOMY     BIOPSY  03/23/2021   Procedure: BIOPSY;  Surgeon: Harvel Quale, MD;  Location[facility];  Service: Gastroenterology;;  small, bowel, gastric polyp   COLONOSCOPY  03/26/2019   mild diverticulosis of sigmoid colon, otherwise  normal   ESOPHAGOGASTRODUODENOSCOPY  07/07/2019   normal stomach and duodenum, HH   ESOPHAGOGASTRODUODENOSCOPY (EGD) WITH PROPOFOL N/A 03/23/2021   Procedure: ESOPHAGOGASTRODUODENOSCOPY (EGD) WITH PROPOFOL;  Surgeon: Harvel Quale, MD;  Location: [facility];  Service: Gastroenterology;  Laterality: N/A;  11:05   ingrown toenails     masectomy     MASTECTOMY Right 2005   right thumb surgery     TONSILLECTOMY     Current Outpatient Medications  Medication Sig Dispense  Refill   acetaminophen (TYLENOL) 650 MG CR tablet Take 650 mg by mouth in the morning.     cetirizine (ZYRTEC) 10 MG tablet Take 10 mg by mouth in the morning.     ergocalciferol (VITAMIN D2) 1.25 MG (50000 UT) capsule Take 50,000 Units by mouth every Sunday.     esomeprazole (NEXIUM) 40 MG capsule Take 1 capsule (40 mg total) by mouth daily at 12 noon. (Patient taking differently: Take 40 mg by mouth in the morning.) 90 capsule 3   furosemide (LASIX) 40 MG tablet Take 40 mg by mouth daily.     L-LYSINE PO Take 1 capsule by mouth See admin instructions. Take 1 capsule (scheduled) by mouth in the morning & may take an additional dose in the evening if needed for cold sores.     PRESCRIPTION MEDICATION Potassium 50mq one daily     rosuvastatin (CRESTOR) 20 MG tablet Take 20 mg by mouth daily.     tiZANidine (ZANAFLEX) 4 MG tablet Take 4 mg by mouth 3 (three) times daily as needed for muscle spasms.     vitamin B-12 (CYANOCOBALAMIN) 1000 MCG tablet Take 1,000 mcg by mouth in the morning.     No current facility-administered medications for this visit.    Allergies as of 07/11/2021 - Review Complete 07/11/2021  Allergen Reaction Noted   Codeine Other (See Comments) 11/21/2010   Estrogens Other (See Comments) 03/01/2021   Sulfa drugs cross reactors Other (See Comments) 11/21/2010    No family history on file.  Social History   Socioeconomic History   Marital status: Married    Spouse name: Not on file   Number of children: 1   Years of education: 12   Highest education level: Not on file  Occupational History   Occupation: not employed  Tobacco Use   Smoking status: Former   Smokeless tobacco: Never  VScientific laboratory technicianUse: Never used  Substance and Sexual Activity   Alcohol use: Never   Drug use: Never   Sexual activity: Not on file  Other Topics Concern   Not on file  Social History Narrative   Right handed   One story home   Drinks caffeine coffee   Social  Determinants of Health   Financial Resource Strain: Not on file  Food Insecurity: Not on file  Transportation Needs: Not on file  Physical Activity: Not on file  Stress: Not on file  Social Connections: Not on file   Review of systems General: negative for malaise, night sweats, fever, chills, weight loss Neck: Negative for lumps, goiter, pain and significant neck swelling Resp: Negative for cough, wheezing, +SOB CV: Negative for chest pain, leg swelling, palpitations, orthopnea GI: denies melena, hematochezia, nausea, vomiting, diarrhea, constipation, dysphagia, odyonophagia, or unintentional weight loss. +early satiety +bloating +epigastric soreness MSK: Negative for joint pain or swelling, back pain, and muscle pain. Derm: Negative for itching or rash Psych: Denies depression, anxiety, memory loss, confusion. No homicidal or suicidal ideation.  Heme: Negative for prolonged bleeding, bruising easily, and swollen nodes. Endocrine: Negative for cold or heat intolerance, polyuria, polydipsia and goiter. Neuro: negative for tremor, gait imbalance, syncope and seizures. The remainder of the review of systems is noncontributory.  Physical Exam: BP (!) 148/77 (BP Location: Right Arm, Patient Position: Sitting, Cuff Size: Large)    Pulse 68    Temp 99.2 F (37.3 C) (Oral)    Ht '5\' 4"'$  (1.626 m)    Wt 152 lb (68.9 kg)    BMI 26.09 kg/m  General:   Alert and oriented. No distress noted. Pleasant and cooperative.  Head:  Normocephalic and atraumatic. Eyes:  Conjuctiva clear without scleral icterus. Mouth:  Oral mucosa pink and moist. Good dentition. No lesions. Heart: Normal rate and rhythm, s1 and s2 heart sounds present.  Lungs: Clear lung sounds in all lobes. Respirations equal and unlabored. Abdomen:  +BS, soft,  and non-distended. Mild tenderness to palpation of epigastric region/LUQNo rebound or guarding. No HSM or masses noted. Derm: No palmar erythema or jaundice Msk:  Symmetrical  without gross deformities. Normal posture. Extremities:  Without edema. Neurologic:  Alert and  oriented x4 Psych:  Alert and cooperative. Normal mood and affect.  Invalid input(s): 6 MONTHS   ASSESSMENT: Kristen Solis is a 66 y.o. female presenting today for follow up s/p EGD in November 2022 for bloating and early satiety.   EGD done in November 2022 without significant findings, suspect symptoms are secondary to underlying IBS. She is maintained on nexium '40mg'$  daily and feels that reflux is well controlled on this. Sleeping with HOB elevated. Patient is still having some early satiety and bloating and some occasional "soreness" in her epigastric area. Taking gas-x without much relief. She is trying to keep track of trigger foods and avoid those. Notably her weight remains stable. I advised her to keep a food journal for the next 2 weeks and try to follow low FODMAP diet to see if she can find a correlation in symptoms and certain foods she is eating, I also encouraged her to start a probiotic to see if this provides any improvement in her symptoms. She continues to experience SOB, though as we discussed previously, I do not feel this is GI related, she has ECHO and stress test coming up next month for further evaluation of this.  No red flag symptoms. Patient denies melena, hematochezia, nausea, vomiting, diarrhea, constipation, dysphagia, odynophagia.     PLAN:  Continue nexium '40mg'$  daily 2. Try probiotic daily 3. Food journal x 2 weeks 4. Low FODMAP diet  All questions were answered, patient verbalized understanding and is in agreement with plan as outline above.    Follow Up: 6 months  Deloris Moger L. Alver Sorrow, MSN, APRN, AGNP-C Adult-Gerontology Nurse Practitioner [facility]

## 2022-01-12 ENCOUNTER — Ambulatory Visit (INDEPENDENT_AMBULATORY_CARE_PROVIDER_SITE_OTHER): Payer: Commercial Managed Care - PPO | Admitting: Gastroenterology

## 2022-01-12 ENCOUNTER — Encounter (INDEPENDENT_AMBULATORY_CARE_PROVIDER_SITE_OTHER): Payer: Self-pay | Admitting: Gastroenterology

## 2022-01-12 VITALS — BP 165/94 | HR 79 | Temp 98.2°F | Ht 64.0 in | Wt 151.0 lb

## 2022-01-12 DIAGNOSIS — R140 Abdominal distension (gaseous): Secondary | ICD-10-CM | POA: Diagnosis not present

## 2022-01-12 DIAGNOSIS — K219 Gastro-esophageal reflux disease without esophagitis: Secondary | ICD-10-CM

## 2022-01-12 DIAGNOSIS — R6881 Early satiety: Secondary | ICD-10-CM

## 2022-01-12 NOTE — Progress Notes (Signed)
Referring Provider: Moshe Cipro, MD Primary Care Physician:  Moshe Cipro, MD Primary GI Physician: Jenetta Downer   Chief Complaint  Patient presents with   Irritable Bowel Syndrome    6 month follow up on IBS. Had vomiting and diarrhea one day recently but has been good for awhile with IBS. Having trouble with acid reflux. Ran out of med for reflux about one month ago.    HPI:   Kristen Solis is a 66 y.o. female with past medical history of melanoma s/p resection of thumb, skin cancer, breast cancer s/p R mastectomy w recurrence but now in remission, fibromyalgia.    Patient presenting today for folow up of bloating/fullness and GERD.  History: Last seen 07/11/21  At that time having fullness and soreness in epigastric area, some nausea but no vomiting. Reflux well controlled on nexium. Some SOB with exertion, eating and sometiems even at rest. Pressure in lower rib area. Taking gas x and cutting out certain foods to help with bloating.   Recommended continue nexium '40mg'$  daily and reflux precautions, food journal and low FOMDAP diet. Start daily probiotic.  Present:  States that she ran out of her Rx PPI and started taking otc Prilosec '20mg'$  which seems to be doing okay for her. She notes that if she forgets a dose she will have pressure in her chest and acid regurgitation, otherwise no breakthrough symptoms. Appetite is okay, she is trying to avoid trigger foods. Sometimes will note that her stomach feels sore if she eats the wrong things. Continues to have bloating and feeling full with some early satiety. Had an episode of nausea and vomiting a couple of days ago where she also had diarrhea but this resolved spontaneously. Denies rectal bleeding or melena. She does note she is hoarse at times and notes swollen lymph nodes in her neck, she is seeing ENT soon for this.   No red flag symptoms. Patient denies melena, hematochezia, nausea, vomiting, diarrhea, constipation, dysphagia,  odyonophagia, or weight loss.   Last Colonoscopy:03/26/19 mild diverticulosis of sigmoid colon, otherwise normal Last Endoscopy:03/23/21 cm hiatal hernia. - A few gastric polyps. -fundic gland polyp - Normal examined duodenum. Biopsies normal  Past Medical History:  Diagnosis Date   Breast cancer [facility])    s/p R mastectomy   Cancer ([place])    Fibromyalgia    Melanoma ([place])    thumb, s/p resection    Past Surgical History:  Procedure Laterality Date   ABDOMINAL HYSTERECTOMY     BIOPSY  03/23/2021   Procedure: BIOPSY;  Surgeon: Montez Morita, Quillian Quince, MD;  [facility];  Service: Gastroenterology;;  small, bowel, gastric polyp   COLONOSCOPY  03/26/2019   mild diverticulosis of sigmoid colon, otherwise normal   ESOPHAGOGASTRODUODENOSCOPY  07/07/2019   normal stomach and duodenum, HH   ESOPHAGOGASTRODUODENOSCOPY (EGD) WITH PROPOFOL N/A 03/23/2021   Procedure: ESOPHAGOGASTRODUODENOSCOPY (EGD) WITH PROPOFOL;  Surgeon: Harvel Quale, MD;  Location[facility];  Service: Gastroenterology;  Laterality: N/A;  11:05   ingrown toenails     masectomy     MASTECTOMY Right 2005   right thumb surgery     TONSILLECTOMY      Current Outpatient Medications  Medication Sig Dispense Refill   acetaminophen (TYLENOL) 650 MG CR tablet Take 650 mg by mouth in the morning.     cetirizine (ZYRTEC) 10 MG tablet Take 10 mg by mouth in the morning.     ergocalciferol (VITAMIN D2) 1.25 MG (50000 UT) capsule Take  50,000 Units by mouth every Sunday.     esomeprazole (NEXIUM) 40 MG capsule Take 1 capsule (40 mg total) by mouth daily at 12 noon. (Patient taking differently: Take 40 mg by mouth in the morning.) 90 capsule 3   furosemide (LASIX) 40 MG tablet Take 40 mg by mouth daily.     L-LYSINE PO Take 1 capsule by mouth See admin instructions. Take 1 capsule (scheduled) by mouth in the morning & may take an additional dose in the evening if needed for cold sores.     PRESCRIPTION  MEDICATION Potassium 66mq one daily     rosuvastatin (CRESTOR) 20 MG tablet Take 20 mg by mouth daily.     tiZANidine (ZANAFLEX) 4 MG tablet Take 4 mg by mouth 3 (three) times daily as needed for muscle spasms.     vitamin B-12 (CYANOCOBALAMIN) 1000 MCG tablet Take 1,000 mcg by mouth in the morning.     No current facility-administered medications for this visit.    Allergies as of 01/12/2022 - Review Complete 07/11/2021  Allergen Reaction Noted   Codeine Other (See Comments) 11/21/2010   Estrogens Other (See Comments) 03/01/2021   Sulfa drugs cross reactors Other (See Comments) 11/21/2010    No family history on file.  Social History   Socioeconomic History   Marital status: Married    Spouse name: Not on file   Number of children: 1   Years of education: 12   Highest education level: Not on file  Occupational History   Occupation: not employed  Tobacco Use   Smoking status: Former   Smokeless tobacco: Never  VScientific laboratory technicianUse: Never used  Substance and Sexual Activity   Alcohol use: Never   Drug use: Never   Sexual activity: Not on file  Other Topics Concern   Not on file  Social History Narrative   Right handed   One story home   Drinks caffeine coffee   Social Determinants of Health   Financial Resource Strain: Not on file  Food Insecurity: Not on file  Transportation Needs: Not on file  Physical Activity: Not on file  Stress: Not on file  Social Connections: Not on file    Review of systems General: negative for malaise, night sweats, fever, chills, weight los Neck: Negative for lumps, goiter, pain and significant neck swelling Resp: Negative for cough, wheezing, dyspnea at rest CV: Negative for chest pain, leg swelling, palpitations, orthopnea GI: denies melena, hematochezia, nausea, vomiting, diarrhea, constipation, dysphagia, odyonophagia,  or unintentional weight loss. +early satiety, +bloating  MSK: Negative for joint pain or swelling, back  pain, and muscle pain. Derm: Negative for itching or rash Psych: Denies depression, anxiety, memory loss, confusion. No homicidal or suicidal ideation.  Heme: Negative for prolonged bleeding, bruising easily, and swollen nodes. Endocrine: Negative for cold or heat intolerance, polyuria, polydipsia and goiter. Neuro: negative for tremor, gait imbalance, syncope and seizures. The remainder of the review of systems is noncontributory.  Physical Exam: There were no vitals taken for this visit. General:   Alert and oriented. No distress noted. Pleasant and cooperative.  Head:  Normocephalic and atraumatic. Eyes:  Conjuctiva clear without scleral icterus. Mouth:  Oral mucosa pink and moist. Good dentition. No lesions. Heart: Normal rate and rhythm, s1 and s2 heart sounds present.  Lungs: Clear lung sounds in all lobes. Respirations equal and unlabored. Abdomen:  +BS, soft, non-tender and non-distended. No rebound or guarding. No HSM or masses noted. Derm: No palmar  erythema or jaundice Msk:  Symmetrical without gross deformities. Normal posture. Extremities:  Without edema. Neurologic:  Alert and  oriented x4 Psych:  Alert and cooperative. Normal mood and affect.  Invalid input(s): "6 MONTHS"   ASSESSMENT: Kristen Solis is a 66 y.o. female presenting today for follow up  GERD appears well managed on otc prilosec '20mg'$  as long as she does not miss a dose. She is trying to avoid trigger foods as well. Will continue with current PPI dosage, she will make me aware if symptoms are not well controlled on this. She should continue with reflux precautions.   Continues to have bloating, feeling full and early satiety. No ongoing nausea or vomiting. EGD in November 2022 without findings to suggest etiology. Will proceed with GES to rule out component of gastroparesis.   Patient denies melena, hematochezia, nausea, vomiting, diarrhea, constipation, dysphagia, odyonophagia, or weight loss.   PLAN:   GES 2. Can continue prilosec '20mg'$  daily  3. Reflux precautions 4. Low FODMAP diet.   All questions were answered, patient verbalized understanding and is in agreement with plan as outlined above.    Follow Up: 6 months  Stace Peace L. Alver Sorrow, MSN, APRN, AGNP-C Adult-Gerontology Nurse Practitioner [facility]

## 2022-01-12 NOTE — Patient Instructions (Signed)
Please continue prilosec '20mg'$  daily, let me know if this is not working Continue with low FODMAP diet and avoiding triggers We will schedule Gastric emptying study for further evaluation  Follow up 6 months

## 2022-01-17 ENCOUNTER — Encounter (HOSPITAL_COMMUNITY)
Admission: RE | Admit: 2022-01-17 | Discharge: 2022-01-17 | Disposition: A | Discharge status: Home / Self Care | Payer: Commercial Managed Care - PPO | Source: Ambulatory Visit | Attending: Gastroenterology | Admitting: Gastroenterology

## 2022-01-17 DIAGNOSIS — R140 Abdominal distension (gaseous): Secondary | ICD-10-CM | POA: Diagnosis present

## 2022-01-17 DIAGNOSIS — R6881 Early satiety: Secondary | ICD-10-CM | POA: Insufficient documentation

## 2022-01-17 MED ORDER — TECHNETIUM TC 99M SULFUR COLLOID
2.0000 | Freq: Once | INTRAVENOUS | Status: AC | PRN
  Administered 2022-01-17: 2.2 via INTRAVENOUS

## 2022-07-18 ENCOUNTER — Ambulatory Visit (INDEPENDENT_AMBULATORY_CARE_PROVIDER_SITE_OTHER): Payer: Commercial Managed Care - PPO | Admitting: Gastroenterology
